# Patient Record
Sex: Male | Born: 1985 | Race: Black or African American | Hispanic: No | Marital: Single | Smoking: Current every day smoker
Health system: Southern US, Community
[De-identification: ages and names within clinical notes are randomized; demographics above are authoritative.]

## PROBLEM LIST (undated history)

## (undated) HISTORY — PX: NO PAST SURGERIES: SHX2092

---
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
## 2006-04-26 ENCOUNTER — Emergency Department: Payer: Self-pay | Admitting: Emergency Medicine

## 2007-01-13 ENCOUNTER — Emergency Department: Payer: Self-pay | Admitting: Emergency Medicine

## 2010-05-04 ENCOUNTER — Emergency Department: Payer: Self-pay | Admitting: Emergency Medicine

## 2011-11-08 ENCOUNTER — Emergency Department: Payer: Self-pay | Admitting: Emergency Medicine

## 2012-09-17 ENCOUNTER — Emergency Department: Payer: Self-pay | Admitting: Emergency Medicine

## 2012-10-31 ENCOUNTER — Emergency Department: Payer: Self-pay | Admitting: Internal Medicine

## 2014-11-18 ENCOUNTER — Emergency Department: Payer: BLUE CROSS/BLUE SHIELD

## 2014-11-18 ENCOUNTER — Emergency Department
Admission: EM | Admit: 2014-11-18 | Discharge: 2014-11-18 | Disposition: A | Discharge status: Home / Self Care | Payer: BLUE CROSS/BLUE SHIELD | Attending: Emergency Medicine | Admitting: Emergency Medicine

## 2014-11-18 ENCOUNTER — Encounter: Payer: Self-pay | Admitting: Medical Oncology

## 2014-11-18 ENCOUNTER — Other Ambulatory Visit: Payer: Self-pay

## 2014-11-18 DIAGNOSIS — R1314 Dysphagia, pharyngoesophageal phase: Secondary | ICD-10-CM | POA: Diagnosis not present

## 2014-11-18 DIAGNOSIS — R1310 Dysphagia, unspecified: Secondary | ICD-10-CM

## 2014-11-18 DIAGNOSIS — Z720 Tobacco use: Secondary | ICD-10-CM | POA: Diagnosis not present

## 2014-11-18 DIAGNOSIS — R1319 Other dysphagia: Secondary | ICD-10-CM

## 2014-11-18 DIAGNOSIS — R0789 Other chest pain: Secondary | ICD-10-CM | POA: Diagnosis present

## 2014-11-18 LAB — CBC
HCT: 44.4 % (ref 40.0–52.0)
Hemoglobin: 14.3 g/dL (ref 13.0–18.0)
MCH: 28.3 pg (ref 26.0–34.0)
MCHC: 32.2 g/dL (ref 32.0–36.0)
MCV: 87.8 fL (ref 80.0–100.0)
Platelets: 279 10*3/uL (ref 150–440)
RBC: 5.06 MIL/uL (ref 4.40–5.90)
RDW: 13.6 % (ref 11.5–14.5)
WBC: 10.9 10*3/uL — ABNORMAL HIGH (ref 3.8–10.6)

## 2014-11-18 LAB — BASIC METABOLIC PANEL
Anion gap: 8 (ref 5–15)
BUN: 16 mg/dL (ref 6–20)
CHLORIDE: 106 mmol/L (ref 101–111)
CO2: 26 mmol/L (ref 22–32)
Calcium: 9.4 mg/dL (ref 8.9–10.3)
Creatinine, Ser: 1.25 mg/dL — ABNORMAL HIGH (ref 0.61–1.24)
GFR calc non Af Amer: >60 mL/min (ref >60)
Glucose, Bld: 100 mg/dL — ABNORMAL HIGH (ref 65–99)
Potassium: 4.1 mmol/L (ref 3.5–5.1)
Sodium: 140 mmol/L (ref 135–145)

## 2014-11-18 LAB — TROPONIN I

## 2014-11-18 MED ORDER — MAGIC MOUTHWASH W/LIDOCAINE: 5.0000 mL | Freq: Four times a day (QID) | ORAL | Status: DC

## 2014-11-18 MED ORDER — GI COCKTAIL ~~LOC~~
30.0000 mL | Freq: Once | ORAL | Status: AC
  Administered 2014-11-18: 30 mL via ORAL

## 2014-11-18 MED ORDER — GI COCKTAIL ~~LOC~~
ORAL | Status: AC
Start: 2014-11-18 — End: 2014-11-18
  Administered 2014-11-18: 30 mL via ORAL
  Filled 2014-11-18: qty 30

## 2014-11-18 NOTE — Discharge Instructions (Signed)
Dysphagia Level 1 Diet, Pureed °The dysphasia level 1 diet includes foods that are completely pureed and smooth. The foods have a pudding-like texture, such as the texture of pureed pancakes, mashed potatoes, and yogurt. The diet does not include foods with lumps or coarse textures. Liquids should be smooth and may either be thin, nectar-thick, honey-like, or spoon-thick. °This diet is helpful for people with moderate to severe swallowing problems. It reduces the risk of food getting caught in the windpipe, trachea, or lungs. You may need help or supervision during meals while following this diet. °WHAT DO I NEED TO KNOW ABOUT THIS DIET? °Foods °· You may eat foods that are soft and have a pudding-like texture. If a food does not have this texture, you may be able to eat the food after: °¨ Pureeing it. This can be done with a blender or whisk. °¨ Moistening it with liquid. For example, you may have bread if you soak it in milk or syrup. °· Avoid foods that are hard, dry, sticky, chunky, lumpy, or stringy. Also avoid foods with nuts, seeds, raisins, skins, and pulp. °· Do not eat foods that you have to chew. If you have to chew the food, then you cannot eat it. °· Eat a variety of foods to get all the nutrients you need. °Liquids °· You may drink liquids that are smooth. Your health care provider will tell you if you should drink thin or thickened liquids. °· To thicken a liquid, use a food and beverage thickener or a thickening food. Thickened liquids are usually a "pudding-like" consistency. °· Thin liquids include fruit juices, milk, coffee, tea, yogurts, shakes, and similar foods that melt to thin liquid at room temperature. °· Avoid liquids with seeds, pulp, or chunks. °See your dietitian or health care provider regularly for help with your dietary changes. °WHAT FOODS CAN I EAT? °Grains °Store-bought soft breads, pancakes, and French toast that have a smooth, moist texture and do not have nuts or seeds (you  will need to moisten the food with liquid). Cooked cereals that have a pudding-like consistency, such as cream of wheat or farina (no oatmeal). Pureed, well-cooked pasta, rice, and plain bread stuffing. °Vegetables °Pureed vegetables. Soft avocado. Smooth tomato paste or sauce. Strained or pureed soups (these may need to be thickened as directed). Mashed or pureed potatoes without skin (can be seasoned with butter, smooth gravy, margarine, or sour cream). °Fruits °Pureed fruits such as melons and apples without seeds or pulp. Mashed bananas. Smooth tomato paste or sauce. Fruit juices without pulp or seeds. Strained or pureed soups. °Meat and Other Protein Sources °Pureed meat. Smooth pate or liverwurst. Smooth souffles. Pureed beans (such as lentils). Pureed eggs. °Dairy °Yogurt. Smooth cheese sauces. Milk (may need to be thickened). Nutritional dairy drinks or shakes. Ask your health care provider whether you can have ice cream. °Condiments °Finely ground salt, pepper, and other ground spices. °Sweets/Desserts °Smooth puddings and custards. Pureed desserts. Souffles. Whipped topping. Ask your health care provider whether you can have frozen desserts. °Fats and Oils °Butter. Margarine. Smooth and strained gravy. Sour cream. Mayonnaise. Cream cheese. Whipped topping. Smooth sauces (such as white sauce, cheese sauce, or hollandaise sauce). °The items listed above may not be a complete list of recommended foods or beverages. Contact your dietitian for more options. °WHAT FOODS ARE NOT RECOMMENDED? °Grains °Oatmeal. Dry cereals. Hard breads. °Vegetables °Whole vegetables. Stringy vegetables (such as celery). Thin tomato sauce. °Fruits °Whole fresh, frozen, canned, or dried fruits that have   not been pureed. Stringy fruits (such as pineapple). °Meat and Other Protein Sources °Whole or ground meat, fish, or poultry. Dried or cooked lentils or legumes that have been cooked but not mashed or pureed. Non-pureed eggs. Nuts and  seeds. Peanut butter. °Dairy °Non-pureed cheese. Dairy products with lumps or chunks. Ask your health care provider whether you can have ice cream. °Condiments °Coarse or seeded herbs and spices. °Sweets/Desserts °Chunky preserves. Jams with seeds. Solid desserts. Sticky, chewy sweets (such as licorice and caramel). Ask your health care provider whether you can have frozen desserts. °Fats and Oils °Sauces of fats with lumps or chunks. °The items listed above may not be a complete list of foods and beverages to avoid. Contact your dietitian for more information. °Document Released: 05/23/2005 Document Revised: 10/07/2013 Document Reviewed: 05/06/2013 °[employer]® Patient Information ©2015 [employer]. This information is not intended to replace advice given to you by your health care provider. Make sure you discuss any questions you have with your health care provider. ° °

## 2014-11-18 NOTE — ED Notes (Signed)
Pt reports having chest pressure that began yesterday, pain worsens with eating/swallowing, feels tight in center with sob.

## 2014-11-18 NOTE — ED Provider Notes (Signed)
[facility] Emergency Department Provider Note  ____________________________________________  Time seen: Approximately 11:06 AM  I have reviewed the triage vital signs and the nursing notes.   HISTORY  Chief Complaint Chest Pain    HPI Bruce Fox is a 29 y.o. male patient complain of pain with swallowing. He states he feel tightness in the center of his chest while passing food. Patient is mild discomfort with swallowing fluids but is able to tolerate it. Onset was yesterday while eating a chicken sandwich at lunch. Patient states since then he's been apprehensive with swallowing solid foods. He is not eating anything since then secondary to discomfort but has been able to tolerate fluids. Patient stated there is no similar incident like this in his past. Patient rates his discomfort as 8/10.   History reviewed. No pertinent past medical history.  There are no active problems to display for this patient.   History reviewed. No pertinent past surgical history.  No current outpatient prescriptions on file.  Allergies Review of patient's allergies indicates no known allergies.  No family history on file.  Social History History  Substance Use Topics  . Smoking status: Current Every Day Smoker    Types: Cigarettes  . Smokeless tobacco: Not on file  . Alcohol Use: No    Review of Systems Constitutional: No fever/chills Eyes: No visual changes. ENT: No sore throat. Cardiovascular: Denies chest pain. Respiratory: Denies shortness of breath. Gastrointestinal: No abdominal pain.  No nausea, no vomiting.  No diarrhea.  No constipation. Genitourinary: Negative for dysuria. Musculoskeletal: Negative for back pain. Skin: Negative for rash. Neurological: Negative for headaches, focal weakness or numbness. 10-point ROS otherwise negative.  ____________________________________________   PHYSICAL EXAM:  VITAL SIGNS: ED Triage Vitals  Enc Vitals  Group     BP 11/18/14 0811 137/87 mmHg     Pulse Rate 11/18/14 0811 60     Resp 11/18/14 0811 18     Temp 11/18/14 0811 98 F (36.7 C)     Temp Source 11/18/14 0811 Oral     SpO2 11/18/14 0811 99 %     Weight 11/18/14 0811 238 lb (107.956 kg)     Height 11/18/14 0811  (1.676 m)     Head Cir --      Peak Flow --      Pain Score 11/18/14 0812 8     Pain Loc --      Pain Edu? --      Excl. in GC? --    Constitutional: Alert and oriented. Well appearing and in no acute distress. Eyes: Conjunctivae are normal. PERRL. EOMI. Head: Atraumatic. Nose: No congestion/rhinnorhea. Mouth/Throat: Mucous membranes are moist.  Oropharynx non-erythematous. Neck: No stridor.   Hematological/Lymphatic/Immunilogical: No cervical lymphadenopathy. Cardiovascular: Normal rate, regular rhythm. Grossly normal heart sounds.  Good peripheral circulation. Respiratory: Normal respiratory effort.  No retractions. Lungs CTAB. Gastrointestinal: Soft and nontender. No distention. No abdominal bruits. No CVA tenderness. Musculoskeletal: No lower extremity tenderness nor edema.  No joint effusions. Neurologic:  Normal speech and language. No gross focal neurologic deficits are appreciated. Speech is normal. No gait instability. Skin:  Skin is warm, dry and intact. No rash noted. Psychiatric: Mood and affect are normal. Speech and behavior are normal.  ____________________________________________   LABS (all labs ordered are listed, but only abnormal results are displayed)  Labs Reviewed  CBC - Abnormal; Notable for the following:    WBC 10.9 (*)    All other components within normal  limits  BASIC METABOLIC PANEL - Abnormal; Notable for the following:    Glucose, Bld 100 (*)    Creatinine, Ser 1.25 (*)    All other components within normal limits  TROPONIN I   ____________________________________________  EKG no acute  findings ____________________________________________  RADIOLOGY   ____________________________________________   PROCEDURES  Procedure(s) performed: None  Critical Care performed: No  ____________________________________________   INITIAL IMPRESSION / ASSESSMENT AND PLAN / ED COURSE  Pertinent labs & imaging results that were available during my care of the patient were reviewed by me and considered in my medical decision making (see chart for details).  Dysphagia ____________________________________________   FINAL CLINICAL IMPRESSION(S) / ED DIAGNOSES  Final diagnoses:  Esophageal dysphagia      Joni Reining, PA-C 11/18/14 1232  Sharman Cheek, MD 11/18/14 [phone]

## 2014-12-11 ENCOUNTER — Emergency Department
Admission: EM | Admit: 2014-12-11 | Discharge: 2014-12-11 | Disposition: A | Discharge status: Home / Self Care | Payer: BLUE CROSS/BLUE SHIELD | Attending: Emergency Medicine | Admitting: Emergency Medicine

## 2014-12-11 ENCOUNTER — Emergency Department: Payer: BLUE CROSS/BLUE SHIELD

## 2014-12-11 ENCOUNTER — Encounter: Payer: Self-pay | Admitting: *Deleted

## 2014-12-11 DIAGNOSIS — Z720 Tobacco use: Secondary | ICD-10-CM | POA: Insufficient documentation

## 2014-12-11 DIAGNOSIS — M779 Enthesopathy, unspecified: Secondary | ICD-10-CM | POA: Diagnosis not present

## 2014-12-11 DIAGNOSIS — Z79899 Other long term (current) drug therapy: Secondary | ICD-10-CM | POA: Diagnosis not present

## 2014-12-11 DIAGNOSIS — M7750 Other enthesopathy of unspecified foot: Secondary | ICD-10-CM

## 2014-12-11 DIAGNOSIS — M79672 Pain in left foot: Secondary | ICD-10-CM | POA: Diagnosis present

## 2014-12-11 MED ORDER — NAPROXEN 500 MG PO TABS: 500.0000 mg | ORAL_TABLET | Freq: Two times a day (BID) | ORAL | Status: DC

## 2014-12-11 MED ORDER — NAPROXEN 500 MG PO TABS
500.0000 mg | ORAL_TABLET | Freq: Once | ORAL | Status: AC
  Administered 2014-12-11: 500 mg via ORAL
  Filled 2014-12-11: qty 1

## 2014-12-11 NOTE — ED Provider Notes (Signed)
[facility] Emergency Department Provider Note  ____________________________________________  Time seen: Approximately 11:34 PM  I have reviewed the triage vital signs and the nursing notes.   HISTORY  Chief Complaint Foot Pain    HPI Bruce Fox is a 29 y.o. male presents to the emergency department for evaluation of the left foot pain. He states he's had pain and swelling since yesterday without any known injury. He states that he has occasional itching to the top of his foot. He has not taken anything for pain. He states that the pain worsens when he attempts to bear weight and put pressure on the ball of his foot.   No past medical history on file.  There are no active problems to display for this patient.   No past surgical history on file.  Current Outpatient Rx  Name  Route  Sig  Dispense  Refill  . Alum & Mag Hydroxide-Simeth (MAGIC MOUTHWASH W/LIDOCAINE) SOLN   Oral   Take 5 mLs by mouth 4 (four) times daily.   100 mL   0   . naproxen (NAPROSYN) 500 MG tablet   Oral   Take 1 tablet (500 mg total) by mouth 2 (two) times daily with a meal.   60 tablet   2     Allergies Review of patient's allergies indicates no known allergies.  No family history on file.  Social History History  Substance Use Topics  . Smoking status: Current Every Day Smoker    Types: Cigarettes  . Smokeless tobacco: Not on file  . Alcohol Use: Yes    Review of Systems Constitutional: No recent illness. Eyes: No visual changes. ENT: No sore throat. Cardiovascular: Denies chest pain or palpitations. Respiratory: Denies shortness of breath. Gastrointestinal: No abdominal pain.  Genitourinary: Negative for dysuria. Musculoskeletal: Pain in left foot Skin: Negative for rash. Neurological: Negative for headaches, focal weakness or numbness. 10-point ROS otherwise negative.  ____________________________________________   PHYSICAL EXAM:  VITAL  SIGNS: ED Triage Vitals  Enc Vitals Group     BP 12/11/14 2209 144/83 mmHg     Pulse Rate 12/11/14 2209 88     Resp 12/11/14 2209 20     Temp 12/11/14 2209 97.8 F (36.6 C)     Temp Source 12/11/14 2209 Oral     SpO2 12/11/14 2209 100 %     Weight 12/11/14 2209 236 lb (107.049 kg)     Height 12/11/14 2209  (1.651 m)     Head Cir --      Peak Flow --      Pain Score 12/11/14 2211 8     Pain Loc --      Pain Edu? --      Excl. in GC? --     Constitutional: Alert and oriented. Well appearing and in no acute distress. Eyes: Conjunctivae are normal. EOMI. Head: Atraumatic. Nose: No congestion/rhinnorhea. Neck: No stridor.  Respiratory: Normal respiratory effort.   Musculoskeletal: Left foot very minimally edematous without obvious cellulitis or deformity. Neurologic:  Normal speech and language. No gross focal neurologic deficits are appreciated. Speech is normal. No gait instability. Skin:  Skin is warm, dry and intact. Atraumatic. No indication of insect bite or cellulitis Psychiatric: Mood and affect are normal. Speech and behavior are normal.  ____________________________________________   LABS (all labs ordered are listed, but only abnormal results are displayed)  Labs Reviewed - No data to display ____________________________________________  RADIOLOGY  Negative for acute pathology. Images  were reviewed by me ____________________________________________   PROCEDURES  Procedure(s) performed: Ace wrap was applied by ER tech. Neurovascularly intact post-application.   ____________________________________________   INITIAL IMPRESSION / ASSESSMENT AND PLAN / ED COURSE  Pertinent labs & imaging results that were available during my care of the patient were reviewed by me and considered in my medical decision making (see chart for details).  Patient was advised to rest, ice, and elevate the left foot throughout the day. He was advised to follow-up with the  podiatrist or orthopedist for symptoms that are not improving over the week. He was advised to return to the emergency department for symptoms that change or worsen if he is unable schedule an appointment. ____________________________________________   FINAL CLINICAL IMPRESSION(S) / ED DIAGNOSES  Final diagnoses:  Tendonitis of ankle or foot      Chinita PesterCari B Jaleisa Brose, FNP 12/11/14 [phone]  Maurilio LovelyNoelle McLaurin, MD 12/12/14 0025

## 2014-12-11 NOTE — Discharge Instructions (Signed)

## 2014-12-11 NOTE — ED Notes (Addendum)
Pt states left foot pain and swelling since yesterday.  No known injury.  Pt has itching to foot.  States unsure if bug bit foot.

## 2015-11-10 ENCOUNTER — Emergency Department: Payer: Worker's Compensation

## 2015-11-10 ENCOUNTER — Encounter: Payer: Self-pay | Admitting: Emergency Medicine

## 2015-11-10 ENCOUNTER — Emergency Department
Admission: EM | Admit: 2015-11-10 | Discharge: 2015-11-10 | Disposition: A | Discharge status: Home / Self Care | Payer: Worker's Compensation | Attending: Emergency Medicine | Admitting: Emergency Medicine

## 2015-11-10 DIAGNOSIS — F17210 Nicotine dependence, cigarettes, uncomplicated: Secondary | ICD-10-CM | POA: Diagnosis not present

## 2015-11-10 DIAGNOSIS — Y9389 Activity, other specified: Secondary | ICD-10-CM | POA: Insufficient documentation

## 2015-11-10 DIAGNOSIS — M25531 Pain in right wrist: Secondary | ICD-10-CM | POA: Diagnosis present

## 2015-11-10 DIAGNOSIS — Y929 Unspecified place or not applicable: Secondary | ICD-10-CM | POA: Insufficient documentation

## 2015-11-10 DIAGNOSIS — S63501A Unspecified sprain of right wrist, initial encounter: Secondary | ICD-10-CM

## 2015-11-10 DIAGNOSIS — Y990 Civilian activity done for income or pay: Secondary | ICD-10-CM | POA: Diagnosis not present

## 2015-11-10 DIAGNOSIS — X503XXA Overexertion from repetitive movements, initial encounter: Secondary | ICD-10-CM | POA: Diagnosis not present

## 2015-11-10 MED ORDER — IBUPROFEN 800 MG PO TABS: 800.0000 mg | ORAL_TABLET | Freq: Three times a day (TID) | ORAL | Status: DC | PRN

## 2015-11-10 NOTE — ED Notes (Signed)
Patient c/o right wrist pain. Denies injury.

## 2015-11-10 NOTE — ED Provider Notes (Signed)
[facility] Emergency Department Provider Note  ____________________________________________  Time seen: Approximately 2:30 PM  I have reviewed the triage vital signs and the nursing notes.   HISTORY  Chief Complaint Wrist Pain    HPI Bruce Fox is a 30 y.o. male who presents for evaluation of right wrist pain. Patient states is from repetitive use of his wrist and hand at work. Denies any injury or trauma. Does a lot of rotating and squeezing his hands. Complains of pains pain both dorsally and plantar aspect of his wrist.   History reviewed. No pertinent past medical history.  There are no active problems to display for this patient.   History reviewed. No pertinent past surgical history.  Current Outpatient Rx  Name  Route  Sig  Dispense  Refill  . ibuprofen (ADVIL,MOTRIN) 800 MG tablet   Oral   Take 1 tablet (800 mg total) by mouth every 8 (eight) hours as needed.   30 tablet   0     Allergies Review of patient's allergies indicates no known allergies.  No family history on file.  Social History Social History  Substance Use Topics  . Smoking status: Current Every Day Smoker    Types: Cigarettes  . Smokeless tobacco: None  . Alcohol Use: Yes    Review of Systems Constitutional: No fever/chills Musculoskeletal: Positive for right wrist pain. Skin: Negative for rash. Neurological: Negative for headaches, focal weakness or numbness.  10-point ROS otherwise negative.  ____________________________________________   PHYSICAL EXAM: BP 133/88 mmHg  Pulse 73  Temp(Src) 98.4 F (36.9 C) (Oral)  Resp 17  Ht 5\' 6"  (1.676 m)  Wt 106.595 kg  BMI 37.95 kg/m2  SpO2 96%  VITAL SIGNS: ED Triage Vitals  Enc Vitals Group     BP --      Pulse --      Resp --      Temp --      Temp src --      SpO2 --      Weight --      Height --      Head Cir --      Peak Flow --      Pain Score --      Pain Loc --      Pain Edu? --       Excl. in GC? --     Constitutional: Alert and oriented. Well appearing and in no acute distress. Cardiovascular: Normal rate, regular rhythm. Grossly normal heart sounds.  Good peripheral circulation. Respiratory: Normal respiratory effort.  No retractions. Lungs CTAB. Musculoskeletal: Mild Tinel's positive. Distally neurovascularly intact. Increased pain with pronation supination of the wrist. Neurologic:  Normal speech and language. No gross focal neurologic deficits are appreciated. No gait instability. Skin:  Skin is warm, dry and intact. No rash noted. Psychiatric: Mood and affect are normal. Speech and behavior are normal.  ____________________________________________   LABS (all labs ordered are listed, but only abnormal results are displayed)  Labs Reviewed - No data to display ____________________________________________  EKG   ____________________________________________  RADIOLOGY  No acute osseous findings.FINDINGS: There is no evidence of fracture or dislocation. There is mild widening of the scapholunate space. There is no evidence of arthropathy or other focal bone abnormality. Soft tissues are unremarkable.  IMPRESSION: 1. Negative for fracture or other acute bone abnormality. 2. Mild scapholunate widening suggesting ligamentous injury. ____________________________________________   PROCEDURES  Procedure(s) performed: None  Critical Care performed: No  ____________________________________________  INITIAL IMPRESSION / ASSESSMENT AND PLAN / ED COURSE  Pertinent labs & imaging results that were available during my care of the patient were reviewed by me and considered in my medical decision making (see chart for details).  Right wrist sprain secondary to repetitive use. Cockup wrist splint given Rx for ibuprofen 800 mg 3 times a day. Patient follow-up with orthopedics for continued evaluation if worsening  symptomology. ____________________________________________   FINAL CLINICAL IMPRESSION(S) / ED DIAGNOSES  Final diagnoses:  Wrist sprain, right, initial encounter     This chart was dictated using voice recognition software/Dragon. Despite best efforts to proofread, errors can occur which can change the meaning. Any change was purely unintentional.   Evangeline Dakin, PA-C 11/10/15 1540  Jeanmarie Plant, MD 11/11/15 2204

## 2015-11-10 NOTE — ED Notes (Signed)
Patient c/o right wrist pain. Patient states he thinks it is from work (repeated use of that hand). Denies injury.

## 2015-11-23 IMAGING — CR DG FOOT COMPLETE 3+V*L*
1 series · 3 of 3 positions shown · non-contrast
Comparison: Left ankle radiographs performed 05/04/2010

CLINICAL DATA: Acute onset of left foot pain and swelling. Initial
encounter.

EXAM:
LEFT FOOT - COMPLETE 3+ VIEW

[Series 1: dg foot complete left · 0.14mm/px · 3 of 3 slices shown]
[im 1/3]
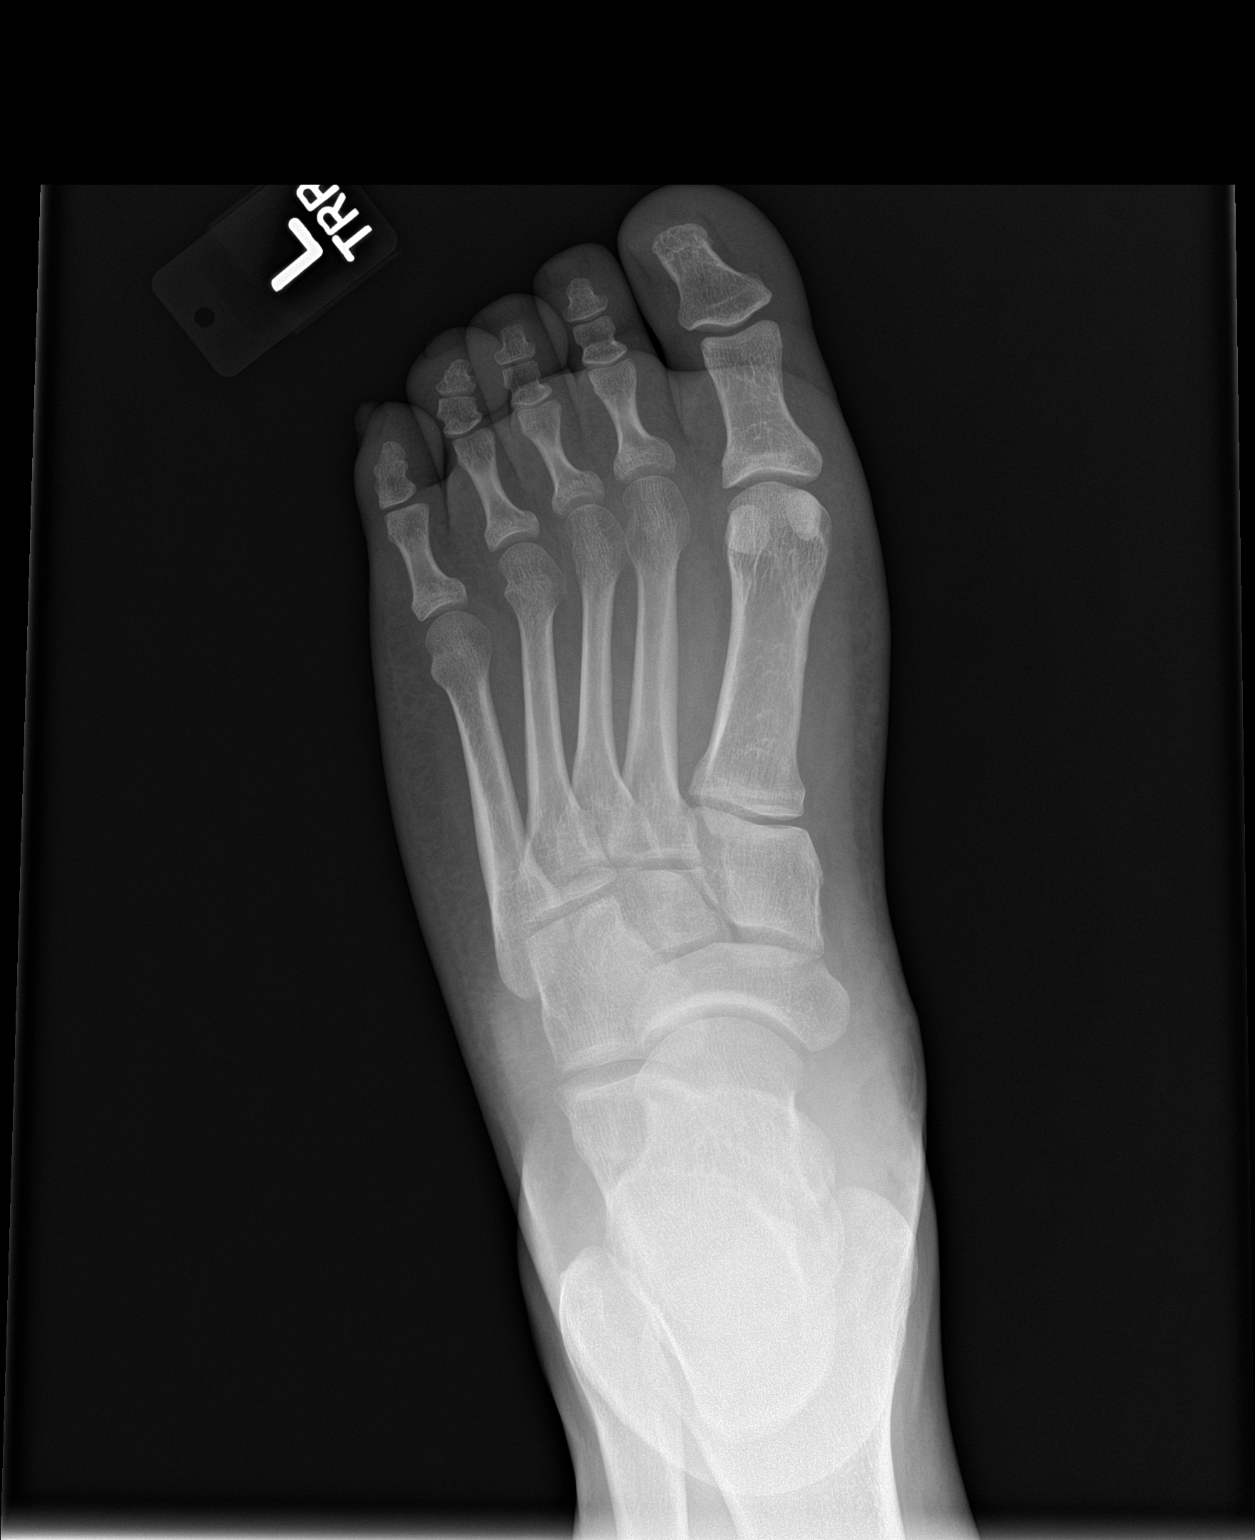
[im 2/3]
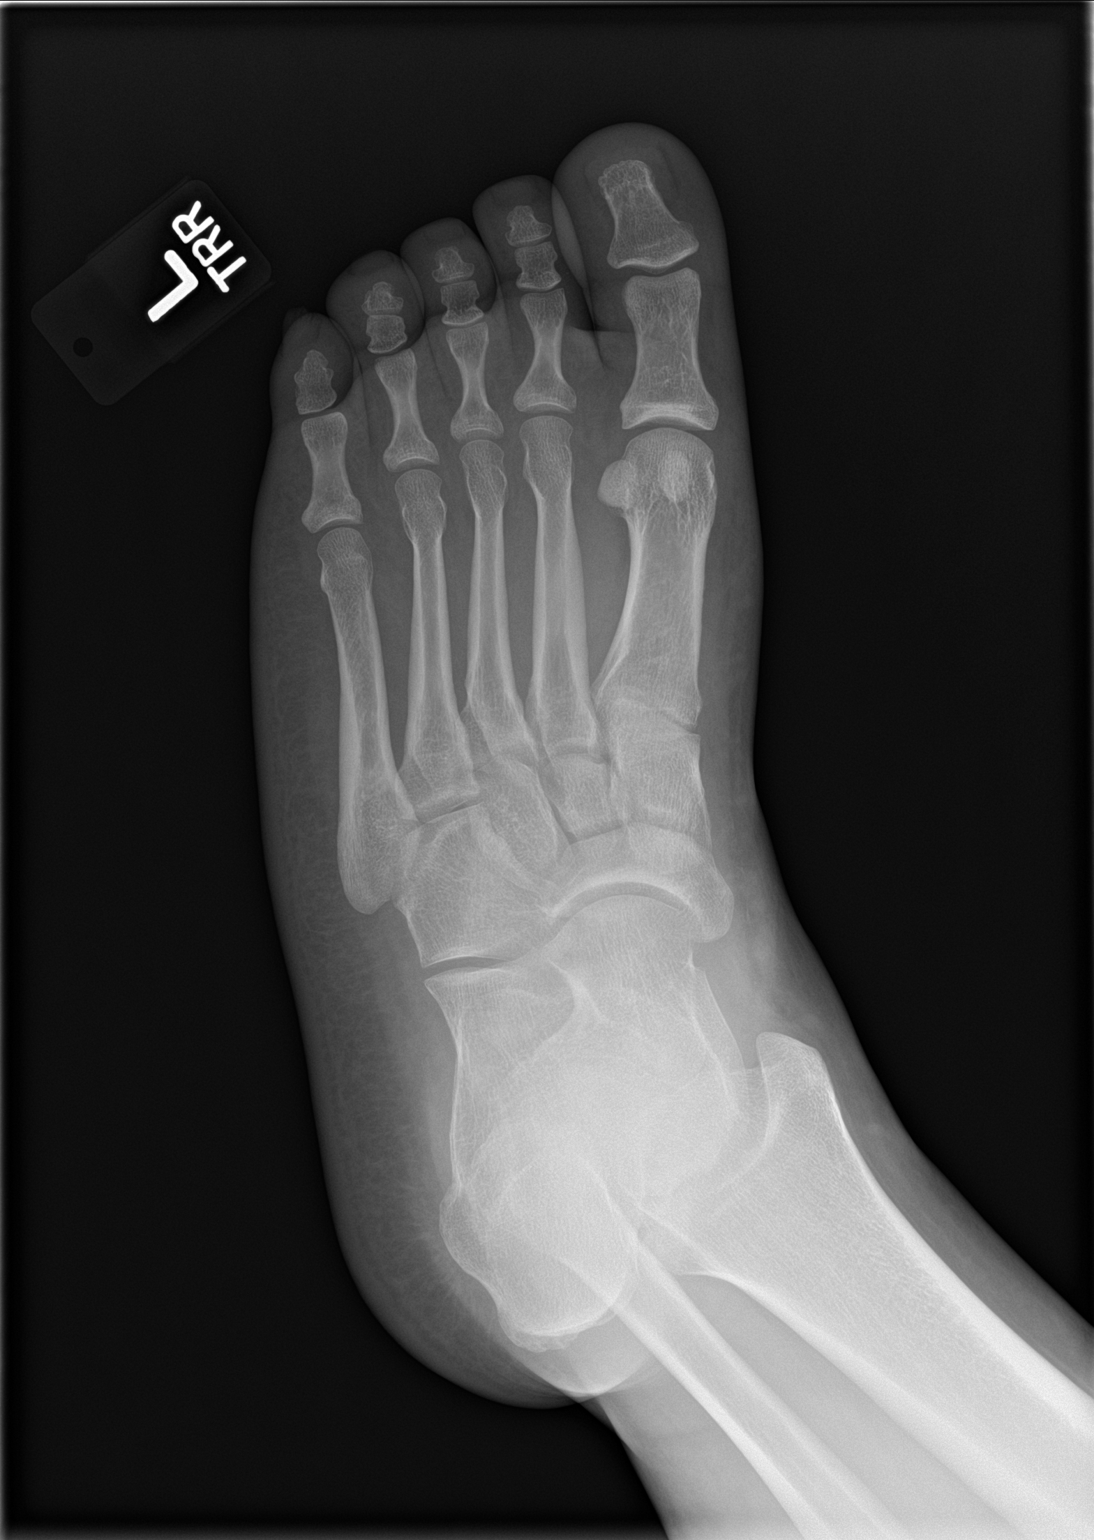
[im 3/3]
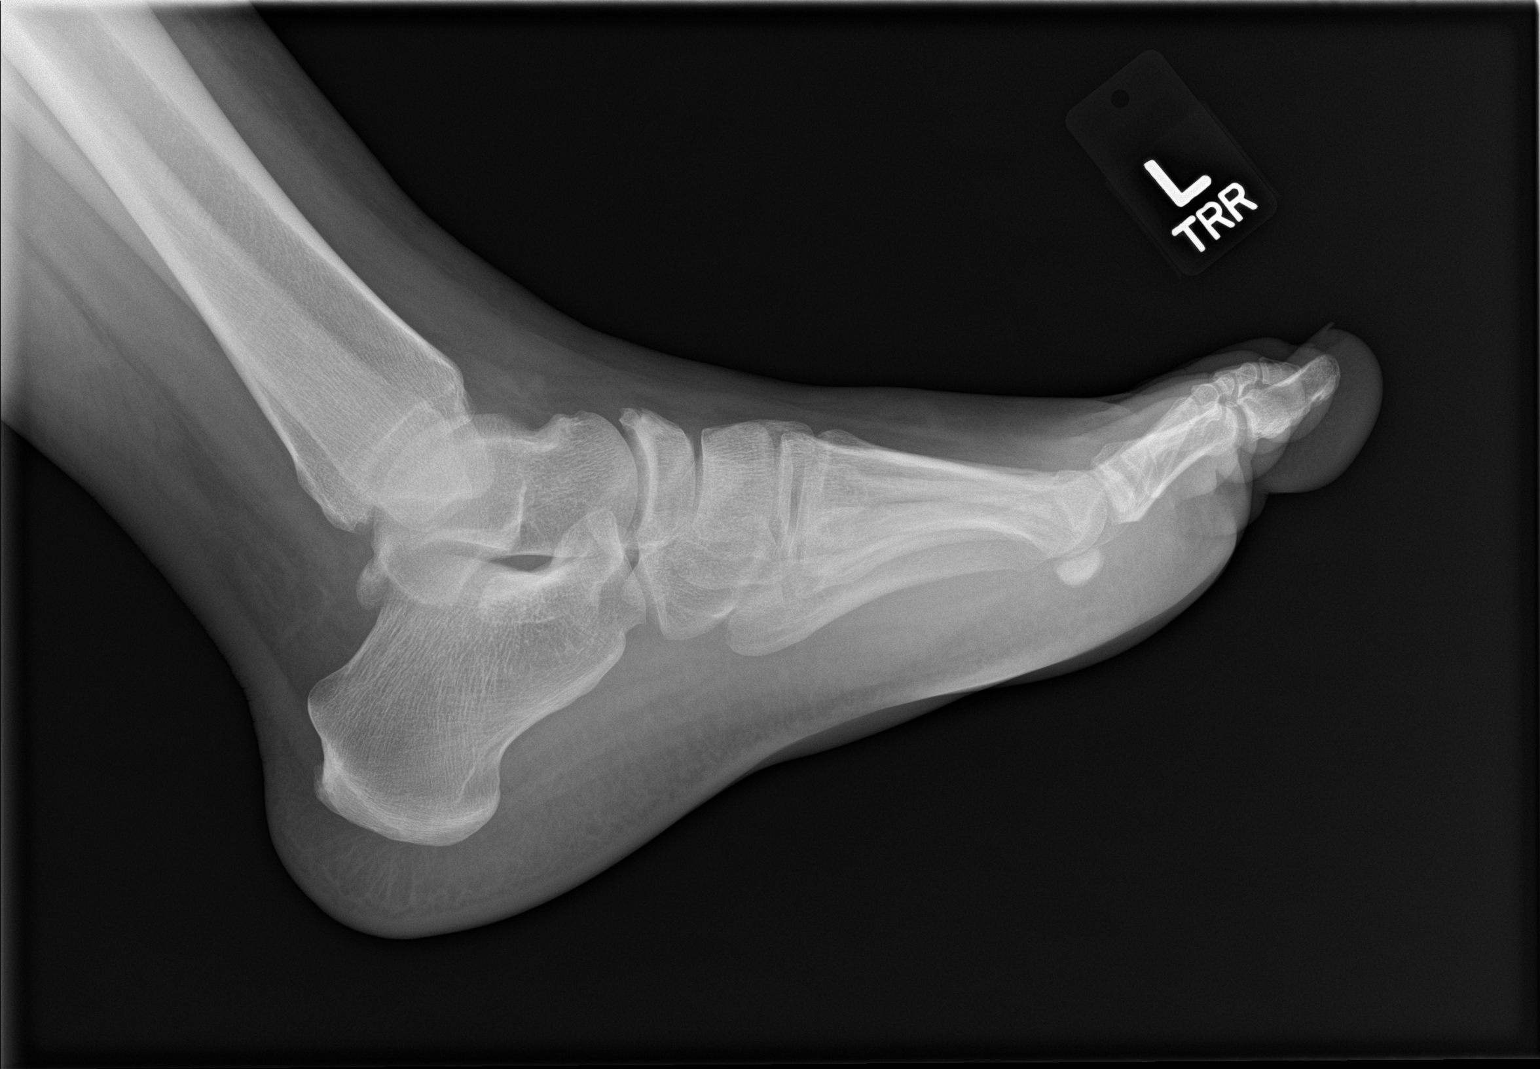

[3 of 3 positions shown; findings below may reference images not displayed]

FINDINGS: There is no evidence of fracture or dislocation. The joint spaces
are preserved. There is no evidence of talar subluxation; the
subtalar joint is unremarkable in appearance.

Mild diffuse soft tissue swelling is suggested about the foot.
IMPRESSION: No evidence of fracture or dislocation.

## 2016-05-03 ENCOUNTER — Ambulatory Visit
Admission: EM | Admit: 2016-05-03 | Discharge: 2016-05-03 | Disposition: A | Discharge status: Home / Self Care | Payer: BLUE CROSS/BLUE SHIELD | Attending: Emergency Medicine | Admitting: Emergency Medicine

## 2016-05-03 DIAGNOSIS — M25572 Pain in left ankle and joints of left foot: Secondary | ICD-10-CM

## 2016-05-03 MED ORDER — NAPROXEN 500 MG PO TABS: 500.0000 mg | ORAL_TABLET | Freq: Two times a day (BID) | ORAL | 0 refills | Status: DC

## 2016-05-03 NOTE — ED Triage Notes (Signed)
Patient complains of left ankle pain. Patient states that he has no known injury to ankle. Patient reports that he has noticed pain with ambulation, reports that it hurts to rotate ankle in either direction.

## 2016-05-03 NOTE — ED Provider Notes (Signed)
HPI  SUBJECTIVE:  Bruce Fox is a 30 y.o. male who presents with  left posterior ankle pain starting last night. States it became constant today. States that is sharp, stabbing with walking and dull with sitting. He has tried Aleve 2 tabs with improvement in his symptoms. Symptoms are worse with walking and movement. Denies trauma to his ankle or foot, erythema, swelling, rash, color changes, numbness. He does report some tingling in the area. No change in his physical activity. He has never had symptoms like this before. Past medical history negative for diabetes, hypertension, reactive arthritis. PMD: None.   History reviewed. No pertinent past medical history.  Past Surgical History:  Procedure Laterality Date  . NO PAST SURGERIES      History reviewed. No pertinent family history.  Social History  Substance Use Topics  . Smoking status: Current Every Day Smoker    Types: Cigarettes  . Smokeless tobacco: Never Used  . Alcohol use Yes     Comment: occasionally    No current facility-administered medications for this encounter.   Current Outpatient Prescriptions:  .  naproxen (NAPROSYN) 500 MG tablet, Take 1 tablet (500 mg total) by mouth 2 (two) times daily., Disp: 30 tablet, Rfl: 0  No Known Allergies   ROS  As noted in HPI.   Physical Exam  BP 125/87 (BP Location: Left Arm)   Pulse 70   Temp 98.2 F (36.8 C) (Oral)   Resp 16   Ht 5\' 6"  (1.676 m)   Wt 235 lb (106.6 kg)   SpO2 98%   BMI 37.93 kg/m   Constitutional: Well developed, well nourished, no acute distress Eyes:  EOMI, conjunctiva normal bilaterally HENT: Normocephalic, atraumatic,mucus membranes moist Respiratory: Normal inspiratory effort Cardiovascular: Normal rate GI: nondistended skin: No rash, skin intact Musculoskeletal: L Ankle Distal fibula NT , Medial malleolus NT,  Deltoid ligament medially NT,   Lateral ligaments NT, ATFL laterally NT , posterior tablofibular ligament laterally NT,  Calcaneofibular ligament tender, Achilles NT, calcaneus NT,  Proximal 5th metatarsal NT, Midfoot NT, distal NVI with baseline sensation / motor to foot with CR<2 seconds. - bruising.  Pt able to bear weight in dept.  Neurologic: Alert & oriented x 3, no focal neuro deficits Psychiatric: Speech and behavior appropriate   ED Course   Medications - No data to display  Orders Placed This Encounter  Procedures  . Apply ASO ankle    Standing Status:   Standing    Number of Occurrences:   1    Order Specific Question:   Laterality    Answer:   Left    No results found for this or any previous visit (from the past 24 hour(s)). No results found.  ED Clinical Impression  Acute left ankle pain   ED Assessment/Plan  Patient has no bony tenderness he is bearing weight in the department. Do not think that he needs an x-ray. Presentation most consistent with a tendinitis of the calcaneofibular ligament. We'll send home and ASO, Naprosyn 500 mg  twice a day, conservative treatment. Follow up with Dr. Rico AlaKransinski, orthopedics in 10 days if no better. Discussed signs and symptoms that should prompt return to the emergency department. Patient agrees with plan. Will also provide primary care referral for routine care.   Meds ordered this encounter  Medications  . naproxen (NAPROSYN) 500 MG tablet    Sig: Take 1 tablet (500 mg total) by mouth 2 (two) times daily.    Dispense:  30 tablet    Refill:  0    *This clinic note was created using Scientist, clinical (histocompatibility and immunogenetics)Dragon dictation software. Therefore, there may be occasional mistakes despite careful proofreading.  ?    Domenick GongAshley Anais Denslow, MD 05/03/16 2025

## 2020-06-06 DIAGNOSIS — M109 Gout, unspecified: Secondary | ICD-10-CM

## 2020-06-06 HISTORY — DX: Gout, unspecified: M10.9

## 2020-12-30 ENCOUNTER — Emergency Department
Admission: EM | Admit: 2020-12-30 | Discharge: 2020-12-30 | Disposition: A | Discharge status: Home / Self Care | Payer: BLUE CROSS/BLUE SHIELD | Attending: Student in an Organized Health Care Education/Training Program | Admitting: Student in an Organized Health Care Education/Training Program

## 2020-12-30 ENCOUNTER — Other Ambulatory Visit: Payer: Self-pay

## 2020-12-30 DIAGNOSIS — F17210 Nicotine dependence, cigarettes, uncomplicated: Secondary | ICD-10-CM | POA: Insufficient documentation

## 2020-12-30 DIAGNOSIS — R059 Cough, unspecified: Secondary | ICD-10-CM

## 2020-12-30 DIAGNOSIS — U071 COVID-19: Secondary | ICD-10-CM | POA: Insufficient documentation

## 2020-12-30 DIAGNOSIS — Z1152 Encounter for screening for COVID-19: Secondary | ICD-10-CM

## 2020-12-30 LAB — SARS CORONAVIRUS 2 (TAT 6-24 HRS): SARS Coronavirus 2: POSITIVE — AB

## 2020-12-30 NOTE — Discharge Instructions (Addendum)
Follow-up with your primary care provider if any continued problems.  You can sign up for MyChart with the instructions that are on your discharge papers.  This way you can see the results of your COVID test.  If your test is positive you will need to quarantine for 10 days since you have not been vaccinated.  Also call anyone that you have been around to let them know that you have tested positive.  If any shortness of breath or difficulty breathing return to the emergency department.  It is important that you stay hydrated with drinking lots of fluids, Tylenol or ibuprofen as needed for fever, body aches or headache.

## 2020-12-30 NOTE — ED Provider Notes (Signed)
[facility] Emergency Department Provider Note  ____________________________________________   Event Date/Time   First MD Initiated Contact with Patient 12/30/20 1141     (approximate)  I have reviewed the triage vital signs and the nursing notes.   HISTORY  Chief Complaint covid test    HPI Bruce Fox is a 35 y.o. male to the ED for COVID screening.  Patient states that he was around a friend over the weekend and the friend tested positive yesterday.  Patient noted that last evening that his cough had increased but patient is a smoker and is used to having a cough.  He is unaware of any fever and denies chills.  There is been no nausea, vomiting or diarrhea.         History reviewed. No pertinent past medical history.  There are no problems to display for this patient.   Past Surgical History:  Procedure Laterality Date   NO PAST SURGERIES      Prior to Admission medications   Not on File    Allergies Patient has no known allergies.  History reviewed. No pertinent family history.  Social History Social History   Tobacco Use   Smoking status: Every Day    Types: Cigarettes   Smokeless tobacco: Never  Substance Use Topics   Alcohol use: Yes    Comment: occasionally   Drug use: No    Review of Systems Constitutional: No fever/chills Eyes: No visual changes. ENT: No sore throat. Cardiovascular: Denies chest pain. Respiratory: Denies shortness of breath.  Positive for cough. Gastrointestinal: No abdominal pain.  No nausea, no vomiting.  No diarrhea.   Musculoskeletal: Negative for musculoskeletal pain. Skin: Negative for rash. Neurological: Negative for headaches, focal weakness or numbness. ____________________________________________   PHYSICAL EXAM:  VITAL SIGNS: ED Triage Vitals [12/30/20 1012]  Enc Vitals Group     BP 119/85     Pulse Rate 94     Resp 18     Temp 99.7 F (37.6 C)     Temp Source Oral      SpO2 98 %     Weight 225 lb (102.1 kg)     Height 5\' 5"  (1.651 m)     Head Circumference      Peak Flow      Pain Score 0     Pain Loc      Pain Edu?      Excl. in GC?     Constitutional: Alert and oriented. Well appearing and in no acute distress. Eyes: Conjunctivae are normal.  Head: Atraumatic. Nose: No congestion/rhinnorhea. Neck: No stridor.   Cardiovascular: Normal rate, regular rhythm. Grossly normal heart sounds.  Good peripheral circulation. Respiratory: Normal respiratory effort.  No retractions. Lungs CTAB. Gastrointestinal: Soft and nontender. No distention.  Musculoskeletal: Moves upper and lower extremities that any difficulty normal gait was noted. Neurologic:  Normal speech and language. No gross focal neurologic deficits are appreciated. No gait instability. Skin:  Skin is warm, dry and intact. No rash noted. Psychiatric: Mood and affect are normal. Speech and behavior are normal.  ____________________________________________   LABS (all labs ordered are listed, but only abnormal results are displayed)  Labs Reviewed  SARS CORONAVIRUS 2 (TAT 6-24 HRS)   ____________________________________________ ___________________________________________   PROCEDURES  Procedure(s) performed (including Critical Care):  Procedures   ____________________________________________   INITIAL IMPRESSION / ASSESSMENT AND PLAN / ED COURSE  As part of my medical decision making, I reviewed the following data  within the electronic MEDICAL RECORD NUMBER Notes from prior ED visits  35 year old male presents to the ED for COVID testing.  He states that over the weekend he was around someone who tested positive yesterday.  Patient normally has a cough because he is a smoker but today he has noticed that it is more often than usual.  Patient is aware that he can see his test results on MyChart and instructions were given to do so.  He was also given a note for his job stating that his  test are pending and if positive he will need to quarantine for 10 days since he is not vaccinated for COVID.  ____________________________________________   FINAL CLINICAL IMPRESSION(S) / ED DIAGNOSES  Final diagnoses:  Cough  Encounter for screening for COVID-19     ED Discharge Orders     None        Note:  This document was prepared using Dragon voice recognition software and may include unintentional dictation errors.    Tommi Rumps, PA-C 12/30/20 1156    Chesley Noon, MD 12/31/20 [phone]

## 2020-12-30 NOTE — ED Triage Notes (Signed)
Wants covid test

## 2021-03-26 ENCOUNTER — Other Ambulatory Visit: Payer: Self-pay

## 2021-03-26 ENCOUNTER — Emergency Department: Payer: Self-pay

## 2021-03-26 ENCOUNTER — Emergency Department
Admission: EM | Admit: 2021-03-26 | Discharge: 2021-03-26 | Disposition: A | Discharge status: Home / Self Care | Payer: Self-pay | Attending: Emergency Medicine | Admitting: Emergency Medicine

## 2021-03-26 DIAGNOSIS — F17210 Nicotine dependence, cigarettes, uncomplicated: Secondary | ICD-10-CM | POA: Insufficient documentation

## 2021-03-26 DIAGNOSIS — M10071 Idiopathic gout, right ankle and foot: Secondary | ICD-10-CM | POA: Insufficient documentation

## 2021-03-26 LAB — BASIC METABOLIC PANEL
Anion gap: 8 (ref 5–15)
BUN: 14 mg/dL (ref 6–20)
CO2: 27 mmol/L (ref 22–32)
Calcium: 9.7 mg/dL (ref 8.9–10.3)
Chloride: 106 mmol/L (ref 98–111)
Creatinine, Ser: 1.01 mg/dL (ref 0.61–1.24)
GFR, Estimated: >60 mL/min (ref >60)
Glucose, Bld: 95 mg/dL (ref 70–99)
Potassium: 4.5 mmol/L (ref 3.5–5.1)
Sodium: 141 mmol/L (ref 135–145)

## 2021-03-26 LAB — CBC WITH DIFFERENTIAL/PLATELET
Abs Immature Granulocytes: 0.04 10*3/uL (ref 0.00–0.07)
Basophils Absolute: 0.1 10*3/uL (ref 0.0–0.1)
Basophils Relative: 0 %
Eosinophils Absolute: 0.2 10*3/uL (ref 0.0–0.5)
Eosinophils Relative: 2 %
HCT: 45.5 % (ref 39.0–52.0)
Hemoglobin: 15.4 g/dL (ref 13.0–17.0)
Immature Granulocytes: 0 %
Lymphocytes Relative: 16 %
Lymphs Abs: 1.8 10*3/uL (ref 0.7–4.0)
MCH: 29.5 pg (ref 26.0–34.0)
MCHC: 33.8 g/dL (ref 30.0–36.0)
MCV: 87.2 fL (ref 80.0–100.0)
Monocytes Absolute: 0.8 10*3/uL (ref 0.1–1.0)
Monocytes Relative: 7 %
Neutro Abs: 8.5 10*3/uL — ABNORMAL HIGH (ref 1.7–7.7)
Neutrophils Relative %: 75 %
Platelets: 240 10*3/uL (ref 150–400)
RBC: 5.22 MIL/uL (ref 4.22–5.81)
RDW: 15.4 % (ref 11.5–15.5)
WBC: 11.4 10*3/uL — ABNORMAL HIGH (ref 4.0–10.5)
nRBC: 0 % (ref 0.0–0.2)

## 2021-03-26 LAB — URIC ACID: Uric Acid, Serum: 9 mg/dL — ABNORMAL HIGH (ref 3.7–8.6)

## 2021-03-26 MED ORDER — INDOMETHACIN 50 MG PO CAPS: 50.0000 mg | ORAL_CAPSULE | Freq: Three times a day (TID) | ORAL | 0 refills | Status: DC

## 2021-03-26 NOTE — Discharge Instructions (Addendum)
Read and follow discharge care instruction.  Take medication as directed. 

## 2021-03-26 NOTE — ED Triage Notes (Signed)
Pt states that he has had this pain in the past before and thinks maybe its gout, he's never been diagnosed with gout in the past, pt states this episode started yesterday and states that its the top of his foot near his ankle and it causes him to be unable to put complete pressure on the foot

## 2021-03-26 NOTE — ED Notes (Signed)
See triage note  presents with pain to left foot  states pain started couple of days ago denies any injury  swelling noted to lateral aspect of foot

## 2021-03-26 NOTE — ED Provider Notes (Signed)
[facility] Emergency Department Provider Note   ____________________________________________   Event Date/Time   First MD Initiated Contact with Patient 03/26/21 [phone]     (approximate)  I have reviewed the triage vital signs and the nursing notes.   HISTORY  Chief Complaint Foot Pain    HPI Bruce Fox is a 35 y.o. male patient presents with few months of  intermitting left dorsal foot pain.  No provocative incident for complaint.  Patient state this episode started 2 days ago.  Patient received mild to moderate relief taking Aleve 2 days ago.  States this morning waking up with increased pain.  Rates pain as a 9/10.  Described pain as "achy".  States pain increased with weightbearing.  Denies loss sensation or loss of function.         No past medical history on file.  There are no problems to display for this patient.   Past Surgical History:  Procedure Laterality Date   NO PAST SURGERIES      Prior to Admission medications   Medication Sig Start Date End Date Taking? Authorizing Provider  indomethacin (INDOCIN) 50 MG capsule Take 1 capsule (50 mg total) by mouth 3 (three) times daily with meals. 03/26/21  Yes Joni Reining, PA-C    Allergies Patient has no known allergies.  No family history on file.  Social History Social History   Tobacco Use   Smoking status: Every Day    Types: Cigarettes   Smokeless tobacco: Never  Substance Use Topics   Alcohol use: Yes    Comment: occasionally   Drug use: No    Review of Systems Constitutional: No fever/chills Eyes: No visual changes. ENT: No sore throat. Cardiovascular: Denies chest pain. Respiratory: Denies shortness of breath. Gastrointestinal: No abdominal pain.  No nausea, no vomiting.  No diarrhea.  No constipation. Genitourinary: Negative for dysuria. Musculoskeletal: Left dorsal foot pain.   Skin: Negative for rash. Neurological: Negative for headaches, focal  weakness or numbness.  ____________________________________________   PHYSICAL EXAM:  VITAL SIGNS: ED Triage Vitals  Enc Vitals Group     BP 03/26/21 0803 (!) 145/93     Pulse Rate 03/26/21 0803 75     Resp 03/26/21 0803 16     Temp 03/26/21 0803 98.5 F (36.9 C)     Temp Source 03/26/21 0803 Oral     SpO2 03/26/21 0803 98 %     Weight 03/26/21 0804 220 lb (99.8 kg)     Height 03/26/21 0804 5\' 6"  (1.676 m)     Head Circumference --      Peak Flow --      Pain Score 03/26/21 0804 9     Pain Loc --      Pain Edu? --      Excl. in GC? --     Constitutional: Alert and oriented. Well appearing and in no acute distress. Eyes: Conjunctivae are normal. PERRL. EOMI. Head: Atraumatic. Nose: No congestion/rhinnorhea. Mouth/Throat: Mucous membranes are moist.  Oropharynx non-erythematous. Neck: No stridor.  No cervical spine tenderness to palpation. Hematological/Lymphatic/Immunilogical: No cervical lymphadenopathy. Cardiovascular: Normal rate, regular rhythm. Grossly normal heart sounds.  Good peripheral circulation. Respiratory: Normal respiratory effort.  No retractions. Lungs CTAB. Gastrointestinal: Soft and nontender. No distention. No abdominal bruits. No CVA tenderness. Genitourinary: Deferred Musculoskeletal: No obvious deformity to the left foot/ankle.  Mild edema but no erythema.   Neurologic:  Normal speech and language. No gross focal neurologic deficits are appreciated.  No gait instability. Skin:  Skin is warm, dry and intact. No rash noted. Psychiatric: Mood and affect are normal. Speech and behavior are normal.  ____________________________________________   LABS (all labs ordered are listed, but only abnormal results are displayed)  Labs Reviewed  CBC WITH DIFFERENTIAL/PLATELET - Abnormal; Notable for the following components:      Result Value   WBC 11.4 (*)    Neutro Abs 8.5 (*)    All other components within normal limits  URIC ACID - Abnormal; Notable for  the following components:   Uric Acid, Serum 9.0 (*)    All other components within normal limits  BASIC METABOLIC PANEL   ____________________________________________  EKG   ____________________________________________  RADIOLOGY I, Joni Reining, personally viewed and evaluated these images (plain radiographs) as part of my medical decision making, as well as reviewing the written report by the radiologist.  ED MD interpretation: No acute bony abnormalities of the right foot.  Official radiology report(s): DG Foot Complete Left  Result Date: 03/26/2021 CLINICAL DATA:  Intermittent pain to dorsal aspect of foot for a few months. Denies any injury. Swelling along lateral aspect of foot. EXAM: LEFT FOOT - COMPLETE 3+ VIEW COMPARISON:  Left foot radiographs 12/11/2014 FINDINGS: There is no acute fracture or dislocation. There is no erosive change. Bony alignment is normal. The joint spaces are preserved. There is mild soft tissue swelling over the lateral aspect of the foot. There is no soft tissue gas or radiopaque foreign body. IMPRESSION: Soft tissue swelling over the lateral aspect of the foot. No acute osseous abnormality. Electronically Signed   By: Lesia Hausen M.D.   On: 03/26/2021 08:57    ____________________________________________   PROCEDURES  Procedure(s) performed (including Critical Care):  Procedures   ____________________________________________   INITIAL IMPRESSION / ASSESSMENT AND PLAN / ED COURSE  As part of my medical decision making, I reviewed the following data within the electronic MEDICAL RECORD NUMBER         Patient presents for intermitting right foot pain and edema.  Discussed no acute findings on x-ray of the right foot with patient.  Discussed lab results with patient.  Patient complaint and physical exam consistent with gout.  Patient given discharge care instruction advised [facility]  Patient given a work note and  prescription for Indocin.      ____________________________________________   FINAL CLINICAL IMPRESSION(S) / ED DIAGNOSES  Final diagnoses:  Acute idiopathic gout of right foot     ED Discharge Orders          Ordered    indomethacin (INDOCIN) 50 MG capsule  3 times daily with meals        03/26/21 0945             Note:  This document was prepared using Dragon voice recognition software and may include unintentional dictation errors.    Joni Reining, PA-C 03/26/21 4010    Minna Antis, MD 03/26/21 [phone]

## 2022-09-04 ENCOUNTER — Other Ambulatory Visit: Payer: Self-pay

## 2022-09-04 ENCOUNTER — Emergency Department
Admission: EM | Admit: 2022-09-04 | Discharge: 2022-09-04 | Disposition: A | Discharge status: Home / Self Care | Payer: BC Managed Care – PPO | Attending: Emergency Medicine | Admitting: Emergency Medicine

## 2022-09-04 DIAGNOSIS — M1000 Idiopathic gout, unspecified site: Secondary | ICD-10-CM

## 2022-09-04 MED ORDER — INDOMETHACIN 25 MG PO CAPS: 25.0000 mg | ORAL_CAPSULE | Freq: Three times a day (TID) | ORAL | 0 refills | Status: AC

## 2022-09-04 MED ORDER — COLCHICINE 0.6 MG PO TABS: 0.6000 mg | ORAL_TABLET | Freq: Two times a day (BID) | ORAL | 2 refills | Status: AC

## 2022-09-04 NOTE — Discharge Instructions (Signed)
Follow-up with your regular doctor if not improving in 2 to 3 days.  Take the Indocin and colchicine as prescribed.  Do not take Aleve or ibuprofen with the Indocin as this is too much on your kidney.  Cut back on any red meat, ground meat, or alcohol.

## 2022-09-04 NOTE — ED Provider Notes (Signed)
[facility] Provider Note    Event Date/Time   First MD Initiated Contact with Patient 09/04/22 [phone]     (approximate)   History   gout flare   HPI  Bruce Fox is a 37 y.o. male with history of gout presents emergency department stating he is having a gout flare.  States his PCP took him off daily colchicine because he was not having many flares.  States he does not have any medication.  Took Aleve without any relief.  No known injury.  Has been eating a lot of sausage recently.  Pain increased 2 days ago.      Physical Exam   Triage Vital Signs: ED Triage Vitals  Enc Vitals Group     BP 09/04/22 0809 (!) 141/103     Pulse Rate 09/04/22 0809 72     Resp 09/04/22 0809 16     Temp 09/04/22 0809 98.1 F (36.7 C)     Temp Source 09/04/22 0809 Oral     SpO2 09/04/22 0809 97 %     Weight 09/04/22 0810 212 lb (96.2 kg)     Height 09/04/22 0810 5\' 6"  (1.676 m)     Head Circumference --      Peak Flow --      Pain Score 09/04/22 0810 10     Pain Loc --      Pain Edu? --      Excl. in [place]? --     Most recent vital signs: Vitals:   09/04/22 0809  BP: (!) 141/103  Pulse: 72  Resp: 16  Temp: 98.1 F (36.7 C)  SpO2: 97%     General: Awake, no distress.   CV:  Good peripheral perfusion. regular rate and  rhythm Resp:  Normal effort. Lungs cta Abd:  No distention.   Other:  Left ankle tender joint, patient is able to walk without difficulty   ED Results / Procedures / Treatments   Labs (all labs ordered are listed, but only abnormal results are displayed) Labs Reviewed - No data to display   EKG     RADIOLOGY     PROCEDURES:   Procedures   MEDICATIONS ORDERED IN ED: Medications - No data to display   IMPRESSION / MDM / [place] / ED COURSE  I reviewed the triage vital signs and the nursing notes.                              Differential diagnosis includes, but is not limited to, gout, arthritis,  sprain  Patient's presentation is most consistent with acute, uncomplicated illness.   Patient's exam is consistent with a gout flare.  Feel that with his history of gout and not having his medication and he basically needs his colchicine along with indomethacin.  Did explain to him not to use the indomethacin with Aleve or ibuprofen.  He is to follow-up with his regular doctor if not improving to 3 days.  Return if worsening.  Discharged in stable condition and in agreement treatment plan.      FINAL CLINICAL IMPRESSION(S) / ED DIAGNOSES   Final diagnoses:  Idiopathic gout, unspecified chronicity, unspecified site     Rx / DC Orders   ED Discharge Orders          Ordered    colchicine 0.6 MG tablet  2 times daily  09/04/22 0826    indomethacin (INDOCIN) 25 MG capsule  3 times daily with meals        09/04/22 [id number]             Note:  This document was prepared using Dragon voice recognition software and may include unintentional dictation errors.    Versie Starks, PA-C 09/04/22 0831    Naaman Plummer, MD 09/04/22 [phone]

## 2022-09-04 NOTE — ED Triage Notes (Signed)
Pt to ED POV for possible gout flare since 2 days ago. States used to take colchicine but PCP took him off this about 6 months ago to monitor course of gout. Pt describes pain as bilateral, in toes, ankles, and top of feet. Pain is sharp but also burning and "tingling".

## 2023-07-23 ENCOUNTER — Emergency Department
Admission: EM | Admit: 2023-07-23 | Discharge: 2023-07-23 | Disposition: A | Discharge status: Home / Self Care | Payer: PRIVATE HEALTH INSURANCE | Attending: Emergency Medicine | Admitting: Emergency Medicine

## 2023-07-23 ENCOUNTER — Encounter: Payer: Self-pay | Admitting: Emergency Medicine

## 2023-07-23 ENCOUNTER — Other Ambulatory Visit: Payer: Self-pay

## 2023-07-23 DIAGNOSIS — D72829 Elevated white blood cell count, unspecified: Secondary | ICD-10-CM | POA: Diagnosis not present

## 2023-07-23 DIAGNOSIS — L03114 Cellulitis of left upper limb: Secondary | ICD-10-CM | POA: Diagnosis not present

## 2023-07-23 DIAGNOSIS — R21 Rash and other nonspecific skin eruption: Secondary | ICD-10-CM | POA: Diagnosis present

## 2023-07-23 LAB — CBC WITH DIFFERENTIAL/PLATELET
Abs Immature Granulocytes: 0.06 10*3/uL (ref 0.00–0.07)
Basophils Absolute: 0.1 10*3/uL (ref 0.0–0.1)
Basophils Relative: 1 %
Eosinophils Absolute: 0.9 10*3/uL — ABNORMAL HIGH (ref 0.0–0.5)
Eosinophils Relative: 6 %
HCT: 46.8 % (ref 39.0–52.0)
Hemoglobin: 15.7 g/dL (ref 13.0–17.0)
Immature Granulocytes: 0 %
Lymphocytes Relative: 14 %
Lymphs Abs: 2.1 10*3/uL (ref 0.7–4.0)
MCH: 28.6 pg (ref 26.0–34.0)
MCHC: 33.5 g/dL (ref 30.0–36.0)
MCV: 85.2 fL (ref 80.0–100.0)
Monocytes Absolute: 1 10*3/uL (ref 0.1–1.0)
Monocytes Relative: 7 %
Neutro Abs: 10.9 10*3/uL — ABNORMAL HIGH (ref 1.7–7.7)
Neutrophils Relative %: 72 %
Platelets: 367 10*3/uL (ref 150–400)
RBC: 5.49 MIL/uL (ref 4.22–5.81)
RDW: 14.8 % (ref 11.5–15.5)
WBC: 15.1 10*3/uL — ABNORMAL HIGH (ref 4.0–10.5)
nRBC: 0 % (ref 0.0–0.2)

## 2023-07-23 LAB — BASIC METABOLIC PANEL
Anion gap: 15 (ref 5–15)
BUN: 14 mg/dL (ref 6–20)
CO2: 26 mmol/L (ref 22–32)
Calcium: 9.6 mg/dL (ref 8.9–10.3)
Chloride: 98 mmol/L (ref 98–111)
Creatinine, Ser: 0.88 mg/dL (ref 0.61–1.24)
GFR, Estimated: >60 mL/min (ref >60)
Glucose, Bld: 90 mg/dL (ref 70–99)
Potassium: 3.9 mmol/L (ref 3.5–5.1)
Sodium: 139 mmol/L (ref 135–145)

## 2023-07-23 MED ORDER — SULFAMETHOXAZOLE-TRIMETHOPRIM 800-160 MG PO TABS: 1.0000 | ORAL_TABLET | Freq: Two times a day (BID) | ORAL | 0 refills | Status: AC

## 2023-07-23 MED ORDER — SULFAMETHOXAZOLE-TRIMETHOPRIM 800-160 MG PO TABS
1.0000 | ORAL_TABLET | Freq: Once | ORAL | Status: AC
  Administered 2023-07-23: 1 via ORAL
  Filled 2023-07-23: qty 1

## 2023-07-23 NOTE — Discharge Instructions (Addendum)
Please follow-up with your primary care provider in 2 days for reassessment of the infection on your left arm.  Please return sooner if the infection looks like it is spreading such as worsening redness especially going up your arm towards her shoulder.  Also return if you start having fevers at home.

## 2023-07-23 NOTE — ED Notes (Signed)
See triage note  Presents with rash to bilateral lower arms and into left groin area   Noticed this rash about 1 week ago  Positive itching

## 2023-07-23 NOTE — ED Triage Notes (Signed)
Patient to ED via POV for rash on left forearm. Ongoing 3-4 days. Redness noted and warmth to touch.

## 2023-07-23 NOTE — ED Provider Notes (Signed)
[facility] Provider Note    Event Date/Time   First MD Initiated Contact with Patient 07/23/23 1255     (approximate)   History   Rash   HPI Bruce Fox is a 38 y.o. male presenting today for rash to left forearm.  Patient states 4 days ago starting to note some redness on his left forearm.  States he works for [employer] and thinks he may have brushed it or cut it while moving through some bushes around the house.  Denies any fever or chills.  Mild pain to the forearm.  It has not been rapidly growing.  No redness extending up the arm.  States itchiness to the area as well.  Slight itchiness to his right forearm as well but has not notices severe redness.     Physical Exam   Triage Vital Signs: ED Triage Vitals [07/23/23 1110]  Encounter Vitals Group     BP (!) 145/109     Systolic BP Percentile      Diastolic BP Percentile      Pulse Rate 98     Resp 18     Temp 98.2 F (36.8 C)     Temp Source Oral     SpO2 98 %     Weight 220 lb (99.8 kg)     Height 5\' 6"  (1.676 m)     Head Circumference      Peak Flow      Pain Score 7     Pain Loc      Pain Education      Exclude from Growth Chart     Most recent vital signs: Vitals:   07/23/23 1110  BP: (!) 145/109  Pulse: 98  Resp: 18  Temp: 98.2 F (36.8 C)  SpO2: 98%   I have reviewed the vital signs. General:  Awake, alert, no acute distress. Head:  Normocephalic, Atraumatic. EENT:  PERRL, EOMI, Oral mucosa pink and moist, Neck is supple. Cardiovascular: Regular rate, 2+ distal pulses. Respiratory:  Normal respiratory effort, symmetrical expansion, no distress.   Extremities:  Moving all four extremities through full ROM without pain.   Neuro:  Alert and oriented.  Interacting appropriately.   Skin: Erythematous and warm area to the dorsal left forearm.  No erythematous streaking up the left forearm or left shoulder.  Scattered erythema to right forearm as well.  See media tab. Psych:  Appropriate affect.    ED Results / Procedures / Treatments   Labs (all labs ordered are listed, but only abnormal results are displayed) Labs Reviewed  CBC WITH DIFFERENTIAL/PLATELET - Abnormal; Notable for the following components:      Result Value   WBC 15.1 (*)    Neutro Abs 10.9 (*)    Eosinophils Absolute 0.9 (*)    All other components within normal limits  BASIC METABOLIC PANEL     EKG    RADIOLOGY    PROCEDURES:  Critical Care performed: No  Procedures   MEDICATIONS ORDERED IN ED: Medications  sulfamethoxazole-trimethoprim (BACTRIM DS) 800-160 MG per tablet 1 tablet (has no administration in time range)     IMPRESSION / MDM / ASSESSMENT AND PLAN / ED COURSE  I reviewed the triage vital signs and the nursing notes.                              Differential diagnosis includes, but is not limited to, cellulitis,  contact dermatitis, poison ivy  Patient's presentation is most consistent with acute complicated illness / injury requiring diagnostic workup.  Patient is a 38 year old male presenting today for rash to left forearm.  Given history, most consistent with either contact dermatitis like poison ivy or cellulitis.  Vital signs are stable and physical exam outside of the rash largely reassuring.  No evidence of sepsis on blood work with mild leukocytosis.  Given slow growth, I do think it is reasonable to start with oral antibiotics at this time as he is not septic.  I have outlined it with a marker and will have him follow-up with his PCP in 2 days for reassessment.  Patient started on Bactrim in the ED.  Was given strict return precautions for any severe worsening symptoms in the next 24 to 48 hours such as spreading erythema or fevers as it may require IV antibiotics at this time.  Patient is agreeable with this plan and will have close follow-up.     FINAL CLINICAL IMPRESSION(S) / ED DIAGNOSES   Final diagnoses:  Cellulitis of left upper extremity      Rx / DC Orders   ED Discharge Orders          Ordered    sulfamethoxazole-trimethoprim (BACTRIM DS) 800-160 MG tablet  2 times daily        07/23/23 1316             Note:  This document was prepared using Dragon voice recognition software and may include unintentional dictation errors.   Janith Lima, MD 07/23/23 1323
